# Patient Record
Sex: Male | Born: 1980 | Race: Black or African American | Hispanic: No | Marital: Single | Smoking: Never smoker
Health system: Southern US, Community
[De-identification: ages and names within clinical notes are randomized; demographics above are authoritative.]

---

## 2016-01-23 ENCOUNTER — Encounter (HOSPITAL_COMMUNITY): Payer: Self-pay | Admitting: Emergency Medicine

## 2016-01-23 ENCOUNTER — Emergency Department (HOSPITAL_COMMUNITY)
Admission: EM | Admit: 2016-01-23 | Discharge: 2016-01-23 | Disposition: A | Discharge status: Home / Self Care | Payer: Self-pay | Attending: Emergency Medicine | Admitting: Emergency Medicine

## 2016-01-23 ENCOUNTER — Emergency Department (HOSPITAL_COMMUNITY): Payer: Self-pay

## 2016-01-23 DIAGNOSIS — Y9289 Other specified places as the place of occurrence of the external cause: Secondary | ICD-10-CM | POA: Insufficient documentation

## 2016-01-23 DIAGNOSIS — S9031XA Contusion of right foot, initial encounter: Secondary | ICD-10-CM | POA: Insufficient documentation

## 2016-01-23 DIAGNOSIS — Y9375 Activity, martial arts: Secondary | ICD-10-CM | POA: Insufficient documentation

## 2016-01-23 DIAGNOSIS — Y998 Other external cause status: Secondary | ICD-10-CM | POA: Insufficient documentation

## 2016-01-23 DIAGNOSIS — W500XXA Accidental hit or strike by another person, initial encounter: Secondary | ICD-10-CM | POA: Insufficient documentation

## 2016-01-23 MED ORDER — IBUPROFEN 800 MG PO TABS: 800.0000 mg | ORAL_TABLET | Freq: Three times a day (TID) | ORAL | Status: AC

## 2016-01-23 NOTE — ED Notes (Signed)
Pt states he was sparring and went to kick someone in the shin pad and missed kicking them in the knee  Pt is c/o pain to his right foot  Pt is not able to bear weight on his foot

## 2016-01-23 NOTE — ED Provider Notes (Signed)
CSN: [id number]     Arrival date & time 01/23/16  1478 History   First MD Initiated Contact with Patient 01/23/16 0518     Chief Complaint  Patient presents with  . Foot Injury     (Consider location/radiation/quality/duration/timing/severity/associated sxs/prior Treatment) HPI Comments: Acute onset of pain last night when performing mixed martial arts - training - tried to kick his opponent in the leg and the lateral dorsum of the foot struck the other person's knee - he had acute onset of pan - is constant, worse with ambulation and feels like pins and needles - there is associated swelling of the foot.  No pain in the knee.  Patient is a 35 y.o. male presenting with foot injury. The history is provided by the patient.  Foot Injury Associated symptoms: no back pain and no neck pain     History reviewed. No pertinent past medical history. History reviewed. No pertinent past surgical history. Family History  Problem Relation Age of Onset  . Hypertension Other   . Diabetes Other   . CAD Other    Social History  Substance Use Topics  . Smoking status: Never Smoker   . Smokeless tobacco: None  . Alcohol Use: No    Review of Systems  Gastrointestinal: Negative for nausea and vomiting.  Musculoskeletal: Positive for joint swelling (Foot). Negative for back pain and neck pain.  Neurological: Negative for weakness and numbness.      Allergies  Review of patient's allergies indicates no known allergies.  Home Medications   Prior to Admission medications   Not on File   BP 152/107 mmHg  Pulse 54  Temp(Src) 97.5 F (36.4 C) (Oral)  Resp 20  SpO2 100% Physical Exam  Constitutional: He appears well-developed and well-nourished. No distress.  HENT:  Head: Normocephalic and atraumatic.  Eyes: Conjunctivae are normal. No scleral icterus.  Cardiovascular: Normal rate, regular rhythm and intact distal pulses.   Pulmonary/Chest: Effort normal and breath sounds normal.   Musculoskeletal: He exhibits tenderness ( over the dorsum of the R foot - lateral and base of the fifth). He exhibits no edema.  Neurological: He is alert.  Skin: Skin is warm and dry. No rash noted. He is not diaphoretic.  Nursing note and vitals reviewed.   ED Course  Procedures (including critical care time) Labs Review Labs Reviewed - No data to display  Imaging Review No results found. I have personally reviewed and evaluated these images and lab results as part of my medical decision-making.    MDM   Final diagnoses:  None    Normal NV exam o fthe foot - but has swelling and ttp that could be c/w fracture - imaging ordered Ice Elevation Declined pain medicines  xrays neg  I have personally viewed and interpreted the imaging and agree with radiologist interpretation.     Daniel Hong, MD 01/23/16 [id number]

## 2016-01-23 NOTE — Discharge Instructions (Signed)
xrays show no fractures Ice, elevate, motrin 3 times a day Do not weight bear for the next 2-3 days

## 2016-11-06 IMAGING — CR DG FOOT COMPLETE 3+V*R*
3 series · 3 of 3 positions shown · non-contrast
Comparison: None.

CLINICAL DATA: Status post injury to right foot kicking someone
while doing martial arts. Pain and bruising about the fourth and
fifth metatarsals. Initial encounter.

EXAM:
RIGHT FOOT COMPLETE - 3+ VIEW

[x foot ap right]
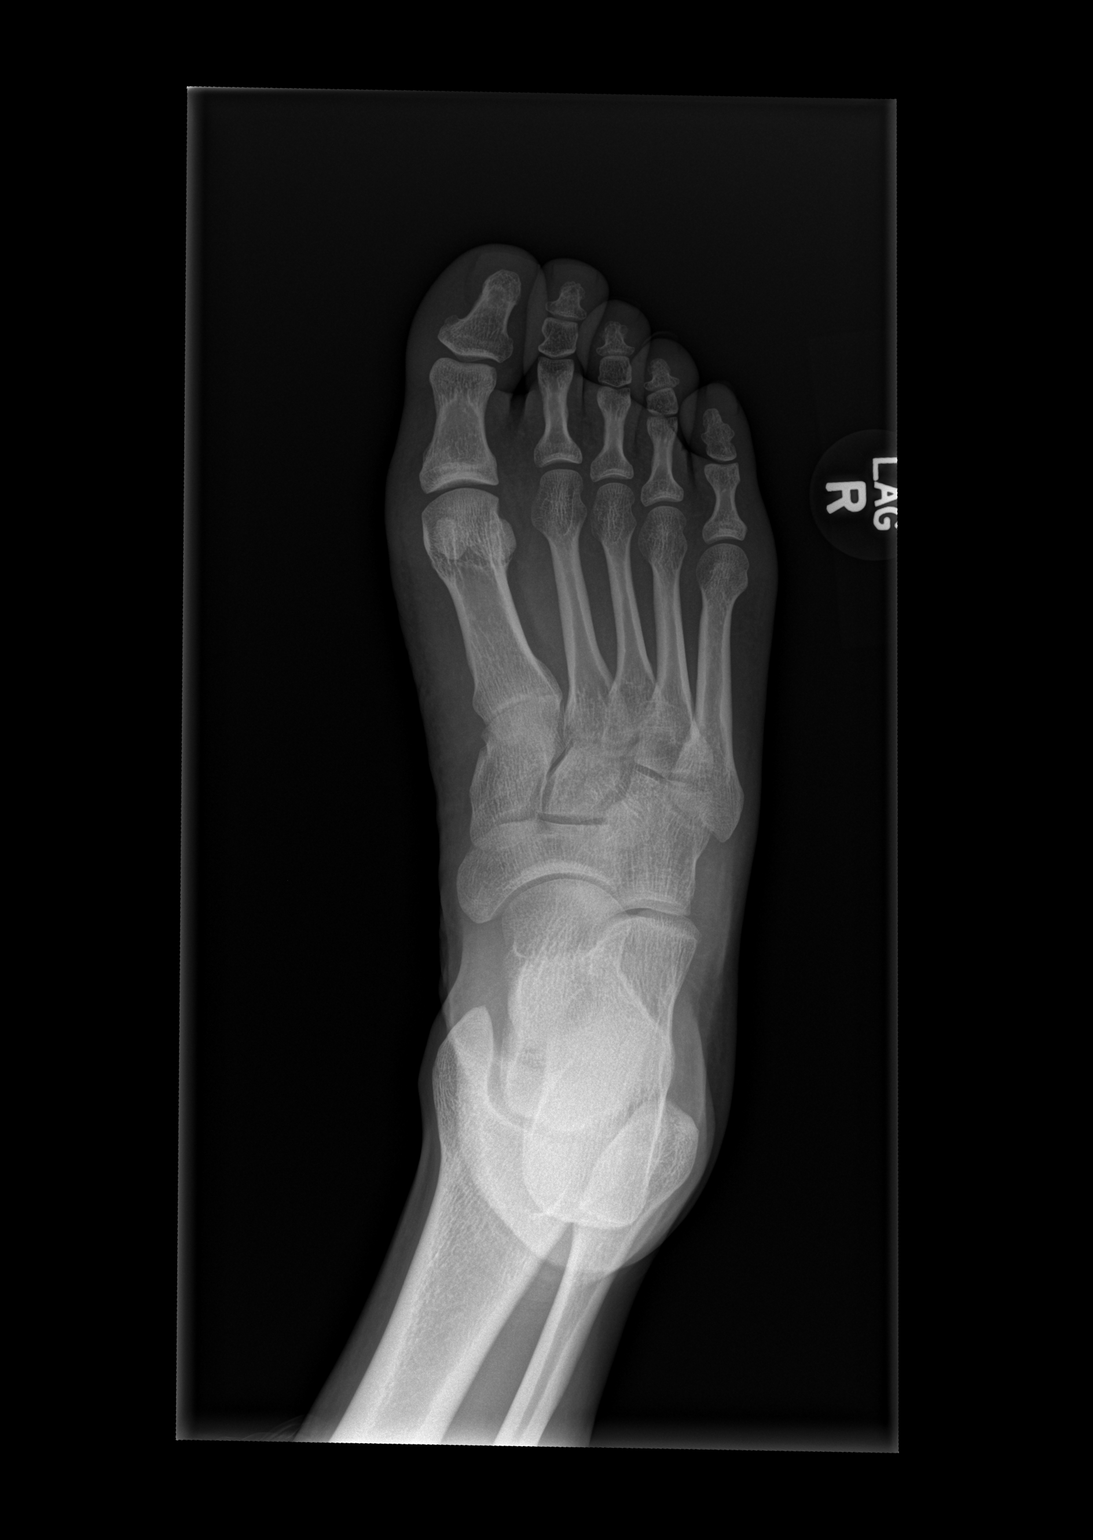

[x foot obl right]
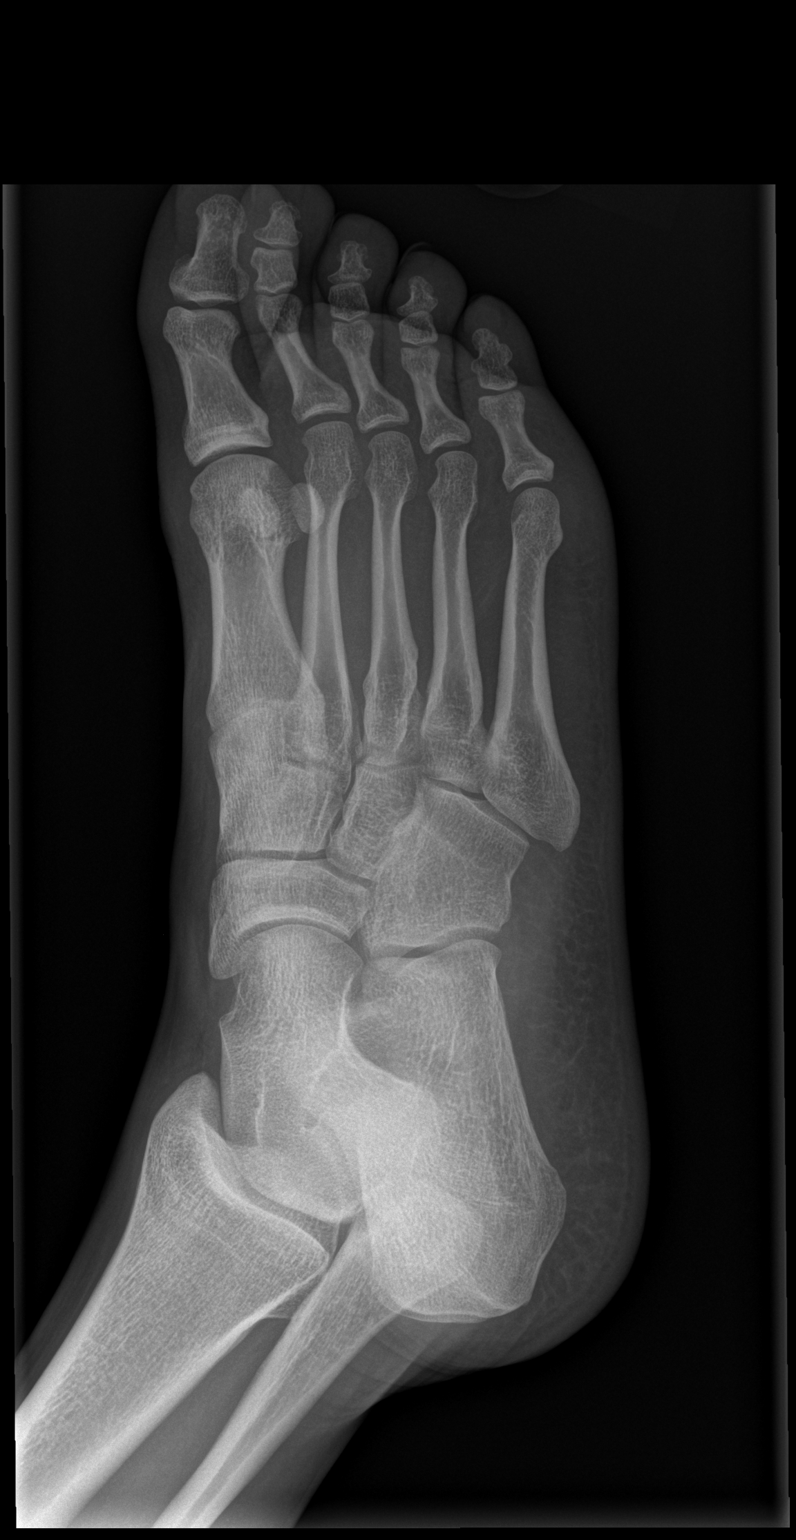

[x foot lat right]
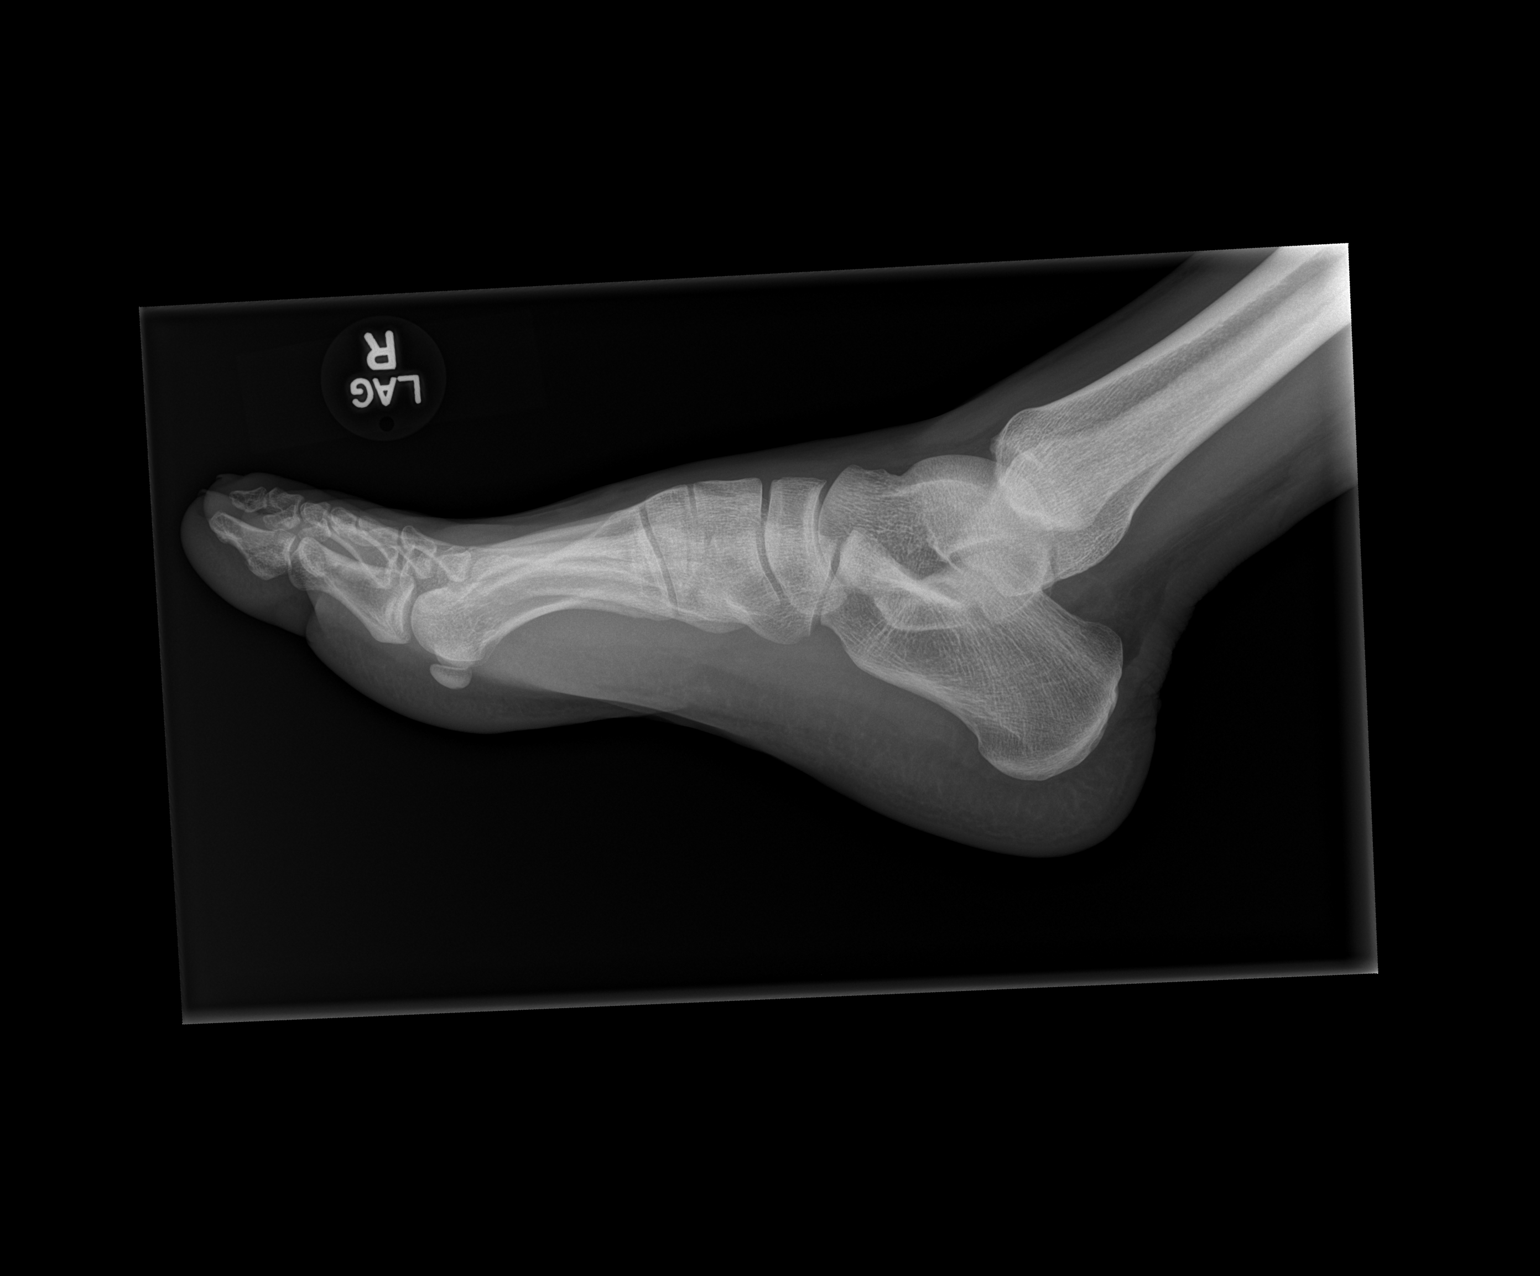

[3 of 3 positions shown; findings below may reference images not displayed]

FINDINGS: There is no evidence of fracture or dislocation. The joint spaces
are preserved. There is no evidence of talar subluxation; the
subtalar joint is unremarkable in appearance.

No significant soft tissue abnormalities are seen.
IMPRESSION: No evidence of fracture or dislocation.
